# Patient Record
Sex: Female | Born: 1960 | Hispanic: Yes | Marital: Married | Smoking: Never smoker
Health system: Southern US, Community
[De-identification: ages and names within clinical notes are randomized; demographics above are authoritative.]

## PROBLEM LIST (undated history)

## (undated) DIAGNOSIS — E119 Type 2 diabetes mellitus without complications: Secondary | ICD-10-CM

## (undated) DIAGNOSIS — I10 Essential (primary) hypertension: Secondary | ICD-10-CM

## (undated) HISTORY — PX: NO PAST SURGERIES: SHX2092

---

## 2018-02-26 ENCOUNTER — Observation Stay
Admission: EM | Admit: 2018-02-26 | Discharge: 2018-02-28 | Disposition: A | Discharge status: Home / Self Care | Payer: Self-pay | Attending: Internal Medicine | Admitting: Internal Medicine

## 2018-02-26 ENCOUNTER — Emergency Department: Payer: Self-pay

## 2018-02-26 DIAGNOSIS — W010XXA Fall on same level from slipping, tripping and stumbling without subsequent striking against object, initial encounter: Secondary | ICD-10-CM | POA: Insufficient documentation

## 2018-02-26 DIAGNOSIS — E119 Type 2 diabetes mellitus without complications: Secondary | ICD-10-CM | POA: Insufficient documentation

## 2018-02-26 DIAGNOSIS — T07XXXA Unspecified multiple injuries, initial encounter: Secondary | ICD-10-CM

## 2018-02-26 DIAGNOSIS — W19XXXA Unspecified fall, initial encounter: Secondary | ICD-10-CM

## 2018-02-26 DIAGNOSIS — I10 Essential (primary) hypertension: Secondary | ICD-10-CM | POA: Insufficient documentation

## 2018-02-26 DIAGNOSIS — S43014A Anterior dislocation of right humerus, initial encounter: Secondary | ICD-10-CM

## 2018-02-26 DIAGNOSIS — J811 Chronic pulmonary edema: Secondary | ICD-10-CM

## 2018-02-26 DIAGNOSIS — J810 Acute pulmonary edema: Secondary | ICD-10-CM

## 2018-02-26 DIAGNOSIS — J9601 Acute respiratory failure with hypoxia: Principal | ICD-10-CM

## 2018-02-26 HISTORY — DX: Essential (primary) hypertension: I10

## 2018-02-26 HISTORY — DX: Type 2 diabetes mellitus without complications: E11.9

## 2018-02-26 MED ORDER — KETAMINE HCL 10 MG/ML IJ SOLN
1.0000 mg/kg | Freq: Once | INTRAMUSCULAR | Status: DC
  Filled 2018-02-26: qty 1

## 2018-02-26 MED ORDER — ONDANSETRON HCL 4 MG/2ML IJ SOLN
4.0000 mg | INTRAMUSCULAR | Status: AC
Start: 2018-02-26 — End: 2018-02-26
  Administered 2018-02-26: 4 mg via INTRAVENOUS
  Filled 2018-02-26: qty 2

## 2018-02-26 MED ORDER — MORPHINE SULFATE (PF) 4 MG/ML IV SOLN
4.0000 mg | Freq: Once | INTRAVENOUS | Status: AC
  Administered 2018-02-26: 4 mg via INTRAVENOUS
  Filled 2018-02-26: qty 1

## 2018-02-26 MED ORDER — ONDANSETRON HCL 4 MG/2ML IJ SOLN
4.0000 mg | Freq: Once | INTRAMUSCULAR | Status: AC
  Administered 2018-02-26: 4 mg via INTRAVENOUS
  Filled 2018-02-26: qty 2

## 2018-02-26 NOTE — ED Triage Notes (Signed)
Patient reports mechanical fall today. Patient c/o upper right arm, elbow and shoulder pain.

## 2018-02-26 NOTE — ED Notes (Signed)
This RN utilized the services of [employer] to assess and triage patient.

## 2018-02-26 NOTE — ED Provider Notes (Signed)
[facility] Emergency Department Provider Note  ____________________________________________   First MD Initiated Contact with Patient 02/26/18 2302     (approximate)  I have reviewed the triage vital signs and the nursing notes.   HISTORY  Chief Complaint Fall  The patient and/or family speak(s) Spanish.  They understand they have the right to the use of a hospital interpreter, however at this time they prefer to speak directly with me in Spanish.  They know that they can ask for an interpreter at any time.  I did utilize interpreter services for the discussion and consent of a procedure (both procedural sedation and reduction).   HPI Valerie Chang is a 57 y.o. female with no contributory past medical history presents for evaluation of acute onset severe sharp and throbbing pain in her right shoulder after mechanical fall.  She reports that she tripped and fell on her outstretched arm and felt immediate onset of severe pain.  She has not been able to move her shoulder since that time due to the pain and she has had some tingling in her fingers since the injury.  The injury occurred approximately 3 hours prior to my evaluation of the patient.  She received morphine 4 mg IV and Zofran 4 mill grams IV initially upon arrival and that made the pain a little bit better but it is become severe again.  She has some abrasions on her right lower leg but no other acute injuries.  She has no pain in her head or neck and she denies chest pain, shortness of breath, nausea, vomiting, abdominal pain, and pain in her legs.  She has no pain in her hands and she is able to flex and extend her elbows and her wrist without any difficulty.  Past Medical History:  Diagnosis Date  . Diabetes mellitus without complication (HCC)   . Hypertension     Patient Active Problem List   Diagnosis Date Noted  . Anterior dislocation of right shoulder 02/27/2018  . Acute respiratory  failure with hypoxia (HCC) 02/27/2018  . HTN (hypertension) 02/27/2018  . Diabetes (HCC) 02/27/2018    Past Surgical History:  Procedure Laterality Date  . NO PAST SURGERIES      Prior to Admission medications   Medication Sig Start Date End Date Taking? Authorizing Provider  HYDROcodone-acetaminophen (NORCO/VICODIN) 5-325 MG tablet Take 1 tablet by mouth every 6 (six) hours as needed for moderate pain or severe pain. 02/27/18   Loleta Rose, MD    Allergies Ketamine and Morphine and related  No family history on file.  Social History Social History   Tobacco Use  . Smoking status: Never Smoker  . Smokeless tobacco: Never Used  Substance Use Topics  . Alcohol use: Not Currently  . Drug use: Not on file    Review of Systems Constitutional: No fever/chills Eyes: No visual changes. ENT: No sore throat. Cardiovascular: Denies chest pain. Respiratory: Denies shortness of breath. Gastrointestinal: No abdominal pain.  No nausea, no vomiting.  No diarrhea.  No constipation. Genitourinary: Negative for dysuria. Musculoskeletal: Acute onset severe pain in right shoulder.  Negative for neck pain.  Negative for back pain. Integumentary: Negative for rash.  Several abrasions on right lower leg Neurological: Negative for headaches, focal weakness or numbness.   ____________________________________________   PHYSICAL EXAM:  VITAL SIGNS: ED Triage Vitals  Enc Vitals Group     BP 02/26/18 2148 (!) 171/73     Pulse Rate 02/26/18 2148 (!) 115  Resp 02/26/18 2148 18     Temp 02/26/18 2148 98.2 F (36.8 C)     Temp Source 02/26/18 2148 Oral     SpO2 02/26/18 2148 95 %     Weight 02/26/18 2151 77.1 kg (170 lb)     Height --      Head Circumference --      Peak Flow --      Pain Score 02/26/18 2151 10     Pain Loc --      Pain Edu? --      Excl. in GC? --     Constitutional: Alert and oriented.  Generally well-appearing but is in moderate to severe distress due to  right shoulder pain Eyes: Conjunctivae are normal.  Head: Atraumatic. Nose: No congestion/rhinnorhea. Mouth/Throat: Mucous membranes are moist. Neck: No stridor.  No meningeal signs.   Cardiovascular: Normal rate, regular rhythm. Good peripheral circulation. Grossly normal heart sounds. Respiratory: Normal respiratory effort.  No retractions. Lungs CTAB. Gastrointestinal: Soft and nontender. No distention.  Musculoskeletal: Obvious deformity of the right shoulder consistent with anterior shoulder dislocation Neurologic:  Normal speech and language. No gross focal neurologic deficits are appreciated.  Skin:  Skin is warm, dry and intact. No rash noted. Psychiatric: Mood and affect are normal. Speech and behavior are normal.  ____________________________________________   LABS (all labs ordered are listed, but only abnormal results are displayed)  Labs Reviewed  CBC WITH DIFFERENTIAL/PLATELET - Abnormal; Notable for the following components:      Result Value   WBC 12.7 (*)    MCV 78.9 (*)    MCH 25.3 (*)    RDW 16.0 (*)    Neutro Abs 11.3 (*)    Lymphs Abs 0.9 (*)    Basophils Absolute 0.2 (*)    All other components within normal limits  COMPREHENSIVE METABOLIC PANEL - Abnormal; Notable for the following components:   Chloride 100 (*)    Glucose, Bld 308 (*)    Calcium 8.7 (*)    AST 44 (*)    Alkaline Phosphatase 130 (*)    All other components within normal limits  TROPONIN I  BRAIN NATRIURETIC PEPTIDE   ____________________________________________  EKG  ED ECG REPORT I, Loleta Rose, the attending physician, personally viewed and interpreted this ECG.  Date: 02/27/2018 EKG Time: 1:19 Rate: 89 Rhythm: normal sinus rhythm QRS Axis: normal Intervals: Bilateral enlargement, nonspecific intraventricular conduction delay ST/T Wave abnormalities: Non-specific ST segment / T-wave changes, but no evidence of acute ischemia. Narrative Interpretation: no evidence of  acute ischemia   ____________________________________________  RADIOLOGY I, Loleta Rose, personally viewed and evaluated these images (plain radiographs) as part of my medical decision making, as well as reviewing the written report by the radiologist.  ED MD interpretation:   Anterior shoulder dislocation which was successfully reduced.  Interval development of pulmonary edema.  Official radiology report(s): Dg Shoulder Right  Result Date: 02/26/2018 CLINICAL DATA:  57 y/o F; mechanical fall with right upper arm, elbow, and shoulder pain. EXAM: RIGHT SHOULDER - 2+ VIEW COMPARISON:  None. FINDINGS: Right anterior shoulder dislocation. No acute fracture identified. Normal acromioclavicular and coracoclavicular intervals. IMPRESSION: Right anterior shoulder dislocation. No acute fracture identified. Electronically Signed   By: Mitzi Hansen M.D.   On: 02/26/2018 22:20   Dg Elbow 2 Views Right  Result Date: 02/26/2018 CLINICAL DATA:  57 y/o F; mechanical fall with right upper arm, elbow, and shoulder pain. EXAM: RIGHT ELBOW - 2 VIEW COMPARISON:  None. FINDINGS:  There is no evidence of fracture, dislocation, or joint effusion. There is no evidence of arthropathy or other focal bone abnormality. Soft tissues are unremarkable. IMPRESSION: Negative. Electronically Signed   By: Mitzi Hansen M.D.   On: 02/26/2018 22:21   Dg Chest Portable 1 View  Result Date: 02/27/2018 CLINICAL DATA:  Acute onset of difficulty breathing. EXAM: PORTABLE CHEST 1 VIEW COMPARISON:  None. FINDINGS: The lungs are well-aerated. Patchy bilateral airspace opacification is concerning for multifocal pneumonia. Mild vascular congestion is suggested. There is no evidence of pleural effusion or pneumothorax. The cardiomediastinal silhouette is borderline normal in size. No acute osseous abnormalities are seen. IMPRESSION: Patchy bilateral airspace opacification is concerning for multifocal pneumonia. Mild  vascular congestion suggested. Electronically Signed   By: Roanna Raider M.D.   On: 02/27/2018 01:41   Dg Shoulder Right Portable  Result Date: 02/27/2018 CLINICAL DATA:  57 y/o  F; post shoulder reduction. EXAM: PORTABLE RIGHT SHOULDER COMPARISON:  02/26/2018 right shoulder radiograph FINDINGS: Interval reduction of the right shoulder dislocation. The humeral head is well seated on the glenoid. No new dislocation or fracture identified. IMPRESSION: Interval reduction of right shoulder dislocation. Humeral head is well seated on the glenoid. Electronically Signed   By: Mitzi Hansen M.D.   On: 02/27/2018 01:11    ____________________________________________   PROCEDURES  Critical Care performed: Yes, see critical care procedure note(s)   Procedure(s) performed:   .Sedation Date/Time: 02/26/2018 11:20 PM Performed by: Loleta Rose, MD Authorized by: Loleta Rose, MD   Consent:    Consent obtained:  Written (electronic informed consent)   Consent given by:  Patient   Risks discussed:  Allergic reaction, dysrhythmia, inadequate sedation, nausea, vomiting, respiratory compromise necessitating ventilatory assistance and intubation, prolonged sedation necessitating reversal and prolonged hypoxia resulting in organ damage   Alternatives discussed:  Analgesia without sedation Universal protocol:    Procedure explained and questions answered to patient or proxy's satisfaction: yes     Relevant documents present and verified: yes     Test results available and properly labeled: yes     Imaging studies available: yes     Required blood products, implants, devices, and special equipment available: yes     Immediately prior to procedure a time out was called: yes     Patient identity confirmation method:  Arm band and verbally with patient Indications:    Procedure performed:  Dislocation reduction   Procedure necessitating sedation performed by:  Different physician (due to  critical patient requiring my attention, Dr. Manson Passey performed the actual reduction and was present in the room for the sedation, see his reduction note)   Intended level of sedation:  Moderate (conscious sedation) Pre-sedation assessment:    Time since last food or drink:  6.5 hours   ASA classification: class 2 - patient with mild systemic disease     Neck mobility: normal     Mouth opening:  3 or more finger widths   Mallampati score:  II - soft palate, uvula, fauces visible   Pre-sedation assessments completed and reviewed: airway patency, cardiovascular function, hydration status, mental status, nausea/vomiting, pain level, respiratory function and temperature     Pre-sedation assessment completed:  02/26/2018 11:21 PM Immediate pre-procedure details:    Reassessment: Patient reassessed immediately prior to procedure     Reviewed: vital signs, relevant labs/tests and NPO status     Verified: bag valve mask available, emergency equipment available, intubation equipment available, IV patency confirmed, oxygen available, reversal medications available and  suction available   Procedure details (see MAR for exact dosages):    Preoxygenation:  Nasal cannula   Sedation:  Ketamine   Intra-procedure monitoring:  Blood pressure monitoring, continuous pulse oximetry, cardiac monitor, frequent vital sign checks and frequent LOC assessments   Total Provider sedation time (minutes):  15 Post-procedure details:    Post-sedation assessment completed:  02/27/2018 12:42 AM   Attendance: Constant attendance by certified staff until patient recovered     Recovery: Patient returned to pre-procedure baseline     Post-sedation assessments completed and reviewed: airway patency, cardiovascular function, hydration status, mental status, nausea/vomiting, pain level and respiratory function     Patient is stable for discharge or admission: yes     Patient tolerance:  Tolerated well, no immediate  complications  .Critical Care Performed by: Loleta Rose, MD Authorized by: Loleta Rose, MD   Critical care provider statement:    Critical care time (minutes):  30   Critical care time was exclusive of:  Separately billable procedures and treating other patients   Critical care was necessary to treat or prevent imminent or life-threatening deterioration of the following conditions:  Respiratory failure   Critical care was time spent personally by me on the following activities:  Development of treatment plan with patient or surrogate, discussions with consultants, evaluation of patient's response to treatment, examination of patient, obtaining history from patient or surrogate, ordering and performing treatments and interventions, ordering and review of laboratory studies, ordering and review of radiographic studies, pulse oximetry, re-evaluation of patient's condition and review of old charts     ____________________________________________   INITIAL IMPRESSION / ASSESSMENT AND PLAN / ED COURSE  As part of my medical decision making, I reviewed the following data within the electronic MEDICAL RECORD NUMBER History obtained from family, Nursing notes reviewed and incorporated, Interpreter needed, Labs reviewed , EKG interpreted , Radiograph reviewed , Discussed with admitting physician  and Notes from prior ED visits    Differential diagnosis includes fracture or dislocation.  She did not lose consciousness and has no evidence of head or neck injury.  Radiograph is consistent with anterior shoulder dislocation of the right shoulder.  It has been out for at least 3 hours and she is very tender; I tried to gently manipulated and see if I could slowly and carefully put her back again since she had already received morphine 4 mill grams IV prior to me seeing her, but she was far too tender and protective of the limb to allow for gentle manipulation.  Given the amount of pain she was in and the  delay in the sedation in the reduction due to needing an interpreter for consent, I provided another 4 mg of morphine.  At that point she was more sedated and the pain was much better, but again she immediately resisted and started crying out in pain when I tried to gently reduce it without full procedural sedation.  I considered her with the use of the interpreter we went over the usual and customary risks and benefits both of sedation as well as for the reduction itself.  Patient and spouse agreed and the spouse reportedly signed the document since the patient had received morphine.  I was called away emergently for a critical patient that arrived by EMS requiring extensive critical care and intubation.  In the meantime, my colleague, Dr. Manson Passey, performed the sedation reduction as per my initial assessment and recommendation for the use of ketamine 1 mg/kg for the sedation.  He documented his reduction note separately in [facility].  He reported that the shoulder felt unstable but was reduced successfully in a shoulder immobilizer was put in place.  Once she was waking up and was able to respond verbally he left the room and her nurse stayed in the room.  Postreduction film has been ordered and I have evaluated the patient when I came out of my other critical patient.  She reportedly had some hypoxemia as she was waking up and was on the nonrebreather but she told me that her arm felt much better and she was no longer hurting.  Awaiting postreduction films and waiting for her to wake up further.  Clinical Course as of Feb 28 403  Fri Feb 27, 2018  0111 Patient's respiratory status has been getting worse.  She was trialed off of nonrebreather and she dropped to 81%.  She is clearing her throat and coughing and now she has thick wet lung sounds throughout her lung fields consistent with pulmonary edema.  Sitting up almost straight and on nonrebreather she is 100% but she is still coughing and clearing her throat.   I have called respiratory and asked him to emergently start her on BiPAP and I have ordered a portable chest x-ray.I am ordering lab work and an EKG.  I asked the patient and her husband again if she was having any difficulty breathing or cough before the procedure, and they said no, but then her husband offered the history that about 4 years ago she received morphine in [place] and she developed a bad cough and difficulty breathing.  This was not mentioned to had a time or listed as an allergy.   [CF]  0114 DG Shoulder Right Portable [CF]  0114 Successful reduction of right shoulder   [CF]  0133 Reviewed CXR, appears consistent with pulmonary edema which is also clinically consistent.  Patient doing a little better on bipap.  Has NTG 1-inch on chest as well to help with decreasing preload.  Discussed with Dr. Sheryle Hail for admission.   [CF]  0143 Has been clarified that it was actually him that had the reaction to morphine, not the patient.  I reviewed the vital sign data and her blood pressure was significantly elevated presumably due to her pain prior to the procedural sedation in the 187/100 range, at least prior to the second dose of morphine.  It is possible that the acute elevation of her blood pressure, either secondary to the ketamine or not, caused acute pulmonary edema, but it is notable that her blood pressure was significantly elevated prior to the procedural sedation and that it never went higher than it was before the second dose of morphine.   [CF]  0154 Nonspecific leukocytosis in the setting of acute injury and pain.  WBC(!): 12.7 [CF]  0214 Hyperglycemic, otherwise unremarkable.  Comprehensive metabolic panel(!) [CF]  [id number] Patient stable on bipap.  Awaiting admission by Dr. Sheryle Hail.   [CF]  [phone] Discussed case in person several times with Dr. Anne Hahn, the admitting physician.  Discussed possibility of aspiration after getting morphine or during sedation, and he is covering empirically  with antibiotics.  Patient is stable on bipap and was just transferred up to the ICU.  Answered any/all questions by patient and husband.   [CF]    Clinical Course User Index [CF] Loleta Rose, MD    ____________________________________________  FINAL CLINICAL IMPRESSION(S) / ED DIAGNOSES  Final diagnoses:  Anterior shoulder dislocation, right, initial encounter  Fall,  initial encounter  Multiple abrasions  Acute respiratory failure with hypoxemia (HCC)  Acute pulmonary edema (HCC)     MEDICATIONS GIVEN DURING THIS VISIT:  Medications  meropenem (MERREM) 1 g in sodium chloride 0.9 % 100 mL IVPB (has no administration in time range)  morphine 4 MG/ML injection 4 mg (4 mg Intravenous Given 02/26/18 2251)  ondansetron (ZOFRAN) injection 4 mg (4 mg Intravenous Given 02/26/18 2249)  ondansetron (ZOFRAN) injection 4 mg (4 mg Intravenous Given 02/26/18 2325)  morphine 4 MG/ML injection 4 mg (4 mg Intravenous Given 02/26/18 2326)  ketamine (KETALAR) injection 77 mg (77 mg Intravenous Given 02/27/18 0033)  naloxone (NARCAN) 2 MG/2ML injection (1 mg Intravenous Given 02/27/18 0034)  nitroGLYCERIN (NITROGLYN) 2 % ointment 1 inch (1 inch Topical Given 02/27/18 0126)     ED Discharge Orders        Ordered    HYDROcodone-acetaminophen (NORCO/VICODIN) 5-325 MG tablet  Every 6 hours PRN     02/27/18 0046       Note:  This document was prepared using Dragon voice recognition software and may include unintentional dictation errors.    Loleta Rose, MD 02/27/18 [phone]

## 2018-02-27 ENCOUNTER — Other Ambulatory Visit: Payer: Self-pay

## 2018-02-27 ENCOUNTER — Emergency Department: Payer: Self-pay

## 2018-02-27 ENCOUNTER — Encounter: Payer: Self-pay | Admitting: Internal Medicine

## 2018-02-27 DIAGNOSIS — E119 Type 2 diabetes mellitus without complications: Secondary | ICD-10-CM

## 2018-02-27 DIAGNOSIS — J9601 Acute respiratory failure with hypoxia: Secondary | ICD-10-CM | POA: Diagnosis present

## 2018-02-27 DIAGNOSIS — I10 Essential (primary) hypertension: Secondary | ICD-10-CM | POA: Diagnosis present

## 2018-02-27 DIAGNOSIS — S43014A Anterior dislocation of right humerus, initial encounter: Secondary | ICD-10-CM | POA: Diagnosis present

## 2018-02-27 LAB — COMPREHENSIVE METABOLIC PANEL
ALBUMIN: 3.8 g/dL (ref 3.5–5.0)
ALT: 52 U/L (ref 14–54)
ANION GAP: 11 (ref 5–15)
AST: 44 U/L — ABNORMAL HIGH (ref 15–41)
Alkaline Phosphatase: 130 U/L — ABNORMAL HIGH (ref 38–126)
BUN: 14 mg/dL (ref 6–20)
CHLORIDE: 100 mmol/L — AB (ref 101–111)
CO2: 24 mmol/L (ref 22–32)
Calcium: 8.7 mg/dL — ABNORMAL LOW (ref 8.9–10.3)
Creatinine, Ser: 0.52 mg/dL (ref 0.44–1.00)
GFR calc Af Amer: >60 mL/min (ref >60)
GFR calc non Af Amer: >60 mL/min (ref >60)
GLUCOSE: 308 mg/dL — AB (ref 65–99)
POTASSIUM: 4 mmol/L (ref 3.5–5.1)
Sodium: 135 mmol/L (ref 135–145)
TOTAL PROTEIN: 7.3 g/dL (ref 6.5–8.1)
Total Bilirubin: 0.5 mg/dL (ref 0.3–1.2)

## 2018-02-27 LAB — CBC
HCT: 33.8 % — ABNORMAL LOW (ref 35.0–47.0)
HEMOGLOBIN: 11 g/dL — AB (ref 12.0–16.0)
MCH: 25.6 pg — AB (ref 26.0–34.0)
MCHC: 32.5 g/dL (ref 32.0–36.0)
MCV: 78.9 fL — AB (ref 80.0–100.0)
Platelets: 241 10*3/uL (ref 150–440)
RBC: 4.28 MIL/uL (ref 3.80–5.20)
RDW: 16 % — ABNORMAL HIGH (ref 11.5–14.5)
WBC: 12.5 10*3/uL — AB (ref 3.6–11.0)

## 2018-02-27 LAB — CREATININE, SERUM
Creatinine, Ser: 0.46 mg/dL (ref 0.44–1.00)
GFR calc non Af Amer: >60 mL/min (ref >60)

## 2018-02-27 LAB — CBC WITH DIFFERENTIAL/PLATELET
BASOS ABS: 0.2 10*3/uL — AB (ref 0–0.1)
BASOS PCT: 1 %
EOS ABS: 0 10*3/uL (ref 0–0.7)
EOS PCT: 0 %
HCT: 37.6 % (ref 35.0–47.0)
HEMOGLOBIN: 12 g/dL (ref 12.0–16.0)
Lymphocytes Relative: 7 %
Lymphs Abs: 0.9 10*3/uL — ABNORMAL LOW (ref 1.0–3.6)
MCH: 25.3 pg — ABNORMAL LOW (ref 26.0–34.0)
MCHC: 32 g/dL (ref 32.0–36.0)
MCV: 78.9 fL — ABNORMAL LOW (ref 80.0–100.0)
Monocytes Absolute: 0.3 10*3/uL (ref 0.2–0.9)
Monocytes Relative: 3 %
NEUTROS PCT: 89 %
Neutro Abs: 11.3 10*3/uL — ABNORMAL HIGH (ref 1.4–6.5)
PLATELETS: 253 10*3/uL (ref 150–440)
RBC: 4.76 MIL/uL (ref 3.80–5.20)
RDW: 16 % — ABNORMAL HIGH (ref 11.5–14.5)
WBC: 12.7 10*3/uL — AB (ref 3.6–11.0)

## 2018-02-27 LAB — GLUCOSE, CAPILLARY
GLUCOSE-CAPILLARY: 195 mg/dL — AB (ref 65–99)
Glucose-Capillary: 289 mg/dL — ABNORMAL HIGH (ref 65–99)
Glucose-Capillary: 237 mg/dL — ABNORMAL HIGH (ref 65–99)
Glucose-Capillary: 180 mg/dL — ABNORMAL HIGH (ref 65–99)
Glucose-Capillary: 178 mg/dL — ABNORMAL HIGH (ref 65–99)

## 2018-02-27 LAB — MRSA PCR SCREENING: MRSA by PCR: NEGATIVE

## 2018-02-27 LAB — TROPONIN I

## 2018-02-27 LAB — BRAIN NATRIURETIC PEPTIDE: B NATRIURETIC PEPTIDE 5: 41 pg/mL (ref 0.0–100.0)

## 2018-02-27 IMAGING — DX DG CHEST 1V PORT
1 series · 1 of 1 positions shown · non-contrast
Comparison: None.

CLINICAL DATA: Acute onset of difficulty breathing.

EXAM:
PORTABLE CHEST 1 VIEW

[chest ap]
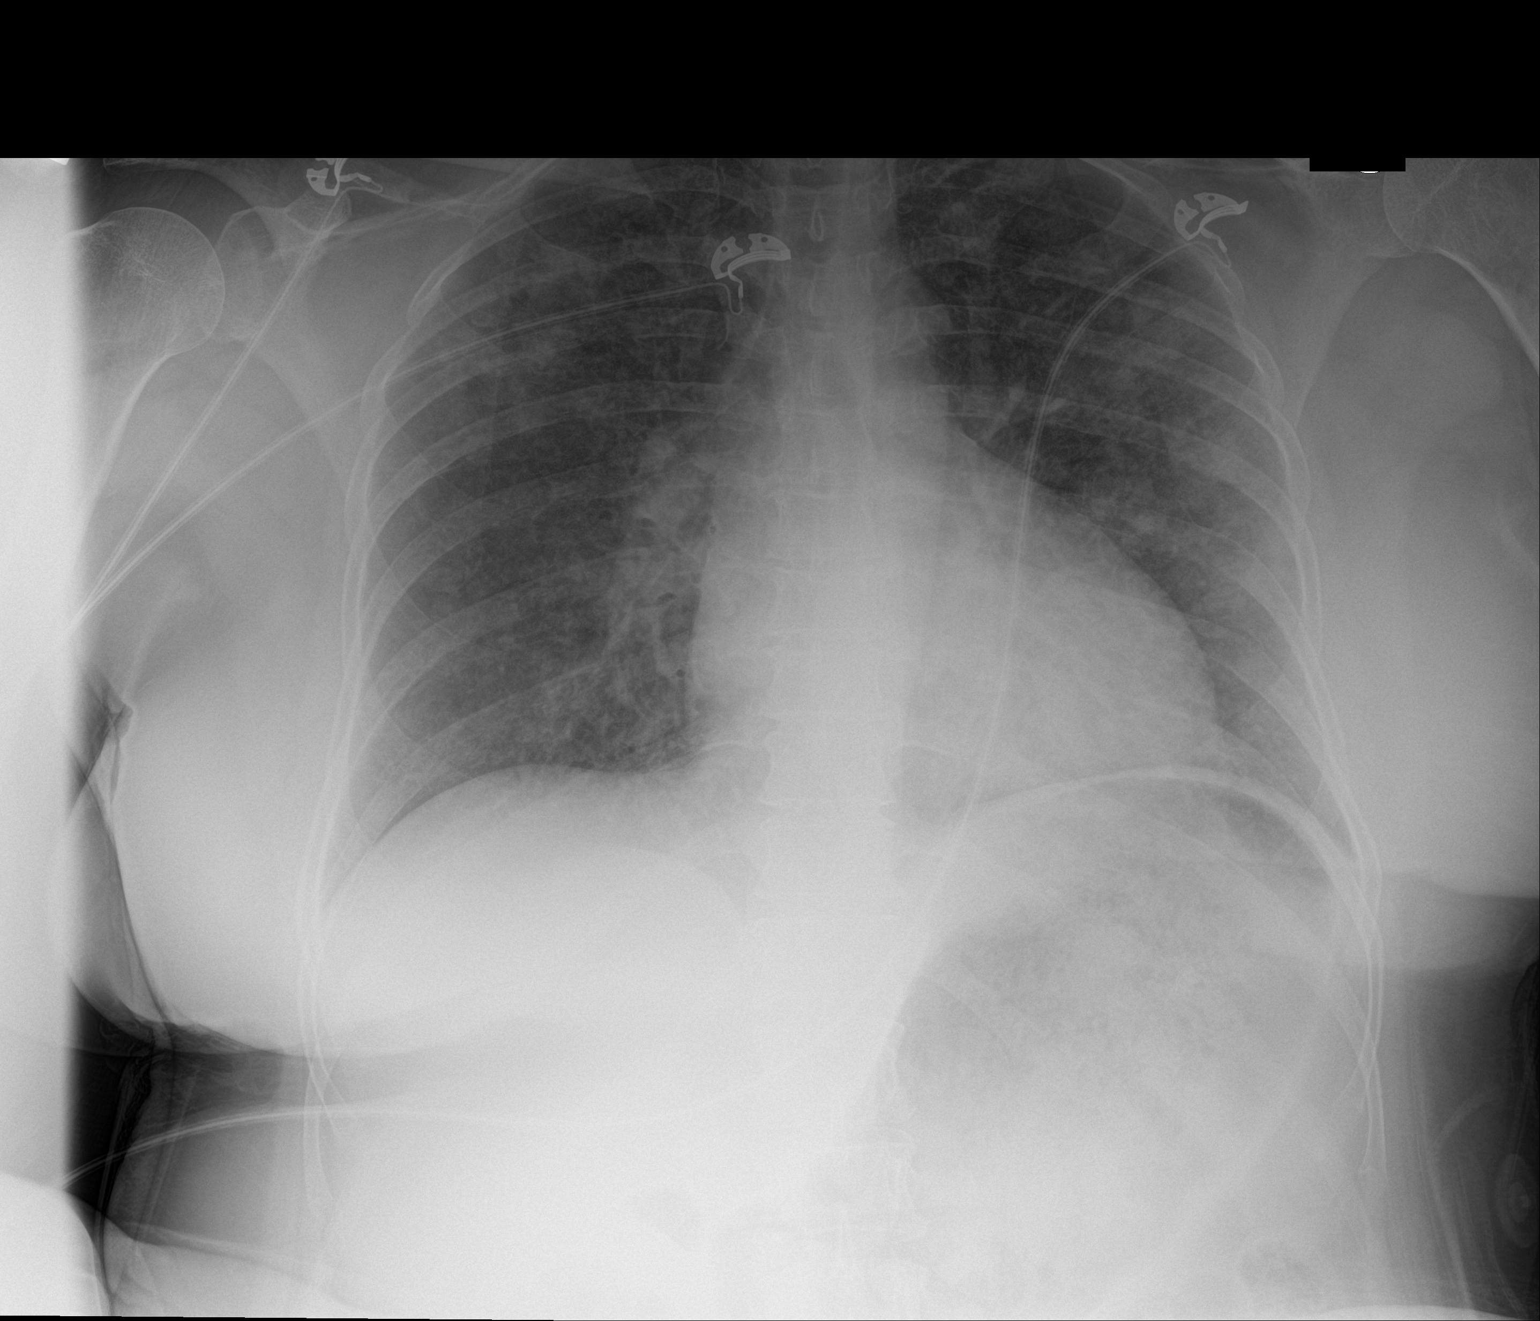

[1 of 1 positions shown; findings below may reference images not displayed]

FINDINGS: The lungs are well-aerated. Patchy bilateral airspace opacification
is concerning for multifocal pneumonia. Mild vascular congestion is
suggested. There is no evidence of pleural effusion or pneumothorax.

The cardiomediastinal silhouette is borderline normal in size. No
acute osseous abnormalities are seen.
IMPRESSION: Patchy bilateral airspace opacification is concerning for multifocal
pneumonia. Mild vascular congestion suggested.

## 2018-02-27 MED ORDER — ENOXAPARIN SODIUM 40 MG/0.4ML ~~LOC~~ SOLN
40.0000 mg | SUBCUTANEOUS | Status: DC
  Administered 2018-02-27: 23:00:00 40 mg via SUBCUTANEOUS
  Filled 2018-02-27: qty 0.4

## 2018-02-27 MED ORDER — ONDANSETRON HCL 4 MG PO TABS: 4.0000 mg | ORAL_TABLET | Freq: Four times a day (QID) | ORAL | Status: DC | PRN

## 2018-02-27 MED ORDER — NITROGLYCERIN 2 % TD OINT
TOPICAL_OINTMENT | TRANSDERMAL | Status: AC
  Administered 2018-02-27: 1 [in_us] via TOPICAL
  Filled 2018-02-27: qty 1

## 2018-02-27 MED ORDER — AMPICILLIN-SULBACTAM SODIUM 3 (2-1) G IJ SOLR
3.0000 g | Freq: Four times a day (QID) | INTRAMUSCULAR | Status: DC
  Administered 2018-02-27 – 2018-02-28 (×2): 3 g via INTRAVENOUS
  Filled 2018-02-27 (×7): qty 3

## 2018-02-27 MED ORDER — HYDROCODONE-ACETAMINOPHEN 5-325 MG PO TABS: 1.0000 | ORAL_TABLET | Freq: Four times a day (QID) | ORAL | 0 refills | Status: DC | PRN

## 2018-02-27 MED ORDER — TRAMADOL HCL 50 MG PO TABS: 50.0000 mg | ORAL_TABLET | Freq: Four times a day (QID) | ORAL | Status: DC | PRN

## 2018-02-27 MED ORDER — SODIUM CHLORIDE 0.9 % IV SOLN
1.0000 g | Freq: Three times a day (TID) | INTRAVENOUS | Status: DC
  Administered 2018-02-27 (×2): 1 g via INTRAVENOUS
  Filled 2018-02-27 (×3): qty 1

## 2018-02-27 MED ORDER — ACETAMINOPHEN 650 MG RE SUPP: 650.0000 mg | Freq: Four times a day (QID) | RECTAL | Status: DC | PRN

## 2018-02-27 MED ORDER — NALOXONE HCL 2 MG/2ML IJ SOSY
PREFILLED_SYRINGE | INTRAMUSCULAR | Status: AC
  Administered 2018-02-27: 1 mg via INTRAVENOUS
  Filled 2018-02-27: qty 2

## 2018-02-27 MED ORDER — ACETAMINOPHEN 325 MG PO TABS
650.0000 mg | ORAL_TABLET | Freq: Four times a day (QID) | ORAL | Status: DC | PRN
  Administered 2018-02-27: 18:00:00 650 mg via ORAL
  Filled 2018-02-27: qty 2

## 2018-02-27 MED ORDER — INSULIN ASPART 100 UNIT/ML ~~LOC~~ SOLN
0.0000 [IU] | Freq: Three times a day (TID) | SUBCUTANEOUS | Status: DC
  Administered 2018-02-27: 2 [IU] via SUBCUTANEOUS
  Administered 2018-02-27: 3 [IU] via SUBCUTANEOUS
  Administered 2018-02-27 – 2018-02-28 (×3): 2 [IU] via SUBCUTANEOUS
  Filled 2018-02-27 (×4): qty 1

## 2018-02-27 MED ORDER — KETAMINE HCL 10 MG/ML IJ SOLN
1.0000 mg/kg | Freq: Once | INTRAMUSCULAR | Status: AC
Start: 2018-02-27 — End: 2018-02-27
  Administered 2018-02-27: 77 mg via INTRAVENOUS

## 2018-02-27 MED ORDER — NITROGLYCERIN 2 % TD OINT
1.0000 [in_us] | TOPICAL_OINTMENT | Freq: Once | TRANSDERMAL | Status: AC
  Administered 2018-02-27: 1 [in_us] via TOPICAL

## 2018-02-27 MED ORDER — ONDANSETRON HCL 4 MG/2ML IJ SOLN
4.0000 mg | Freq: Four times a day (QID) | INTRAMUSCULAR | Status: DC | PRN
  Administered 2018-02-27: 4 mg via INTRAVENOUS
  Filled 2018-02-27: qty 2

## 2018-02-27 MED ORDER — INSULIN ASPART 100 UNIT/ML ~~LOC~~ SOLN: 0.0000 [IU] | Freq: Every day | SUBCUTANEOUS | Status: DC

## 2018-02-27 NOTE — Discharge Instructions (Addendum)
It is very important that you keep on the shoulder immobilizer and follow up with orthopedics.  Your shoulder is very unstable, meaning that it may fall out of place again.  Use over-the-counter pain medication (Tylenol and ibuprofen) according to label instructions.  You may use ice packs to help with pain and swelling.  Take Norco as prescribed for severe pain. Do not drink alcohol, drive or participate in any other potentially dangerous activities while taking this medication as it may make you sleepy. Do not take this medication with any other sedating medications, either prescription or over-the-counter. If you were prescribed Percocet or Vicodin, do not take these with acetaminophen (Tylenol) as it is already contained within these medications.   This medication is an opiate (or narcotic) pain medication and can be habit forming.  Use it as little as possible to achieve adequate pain control.  Do not use or use it with extreme caution if you have a history of opiate abuse or dependence.  If you are on a pain contract with your primary care doctor or a pain specialist, be sure to let them know you were prescribed this medication today from the [facility].  This medication is intended for your use only - do not give any to anyone else and keep it in a secure place where nobody else, especially children, have access to it.  It will also cause or worsen constipation, so you may want to consider taking an over-the-counter stool softener while you are taking this medication.    Return to the emergency department if you develop new or worsening symptoms that concern you.

## 2018-02-27 NOTE — Sedation Documentation (Signed)
Family updated as to patient's status.

## 2018-02-27 NOTE — Sedation Documentation (Signed)
ED Provider at bedside discussing change on pt's ability to breathe

## 2018-02-27 NOTE — Progress Notes (Signed)
Principal Problem:   Acute respiratory failure with hypoxia (HCC) -unclear etiology, though sedating medications almost certainly played a role.  Patient did not receive larger than normal doses, though she seemed to have unforeseeable he reacted very strongly to these medicines.  We will continue BiPAP for now until the patient is able to wean, see below for treatment of other conditions Active Problems:   Anterior dislocation of right shoulder -successfully reduced, continue immobilizer   Pneumonia -unclear if her x-ray imaging represents pneumonia, possibly from aspiration during her hypoxia event.  Covered her with antibiotics upfront.  Would repeat chest x-ray in the morning to see if it is any different, just to rule out any possibility of edema as a causative agent of her x-ray opacities   HTN (hypertension) -continue home meds   Diabetes (HCC) -sliding scale insulin with corresponding glucose checks  Chart review performedand case discussed with ED provider. Labs, imaging and/or ECG reviewed by provider and discussed with patient/family. Management plans discussed with the patient and/or family.  DVT PROPHYLAXIS: SubQ lovenox  GI PROPHYLAXIS: None  ADMISSION STATUS: Observation  Agree with above

## 2018-02-27 NOTE — Sedation Documentation (Signed)
ED Provider at bedside. Pt and husband updated.

## 2018-02-27 NOTE — Consult Note (Signed)
Name: Valerie Chang MRN: [id number] DOB: 26-Jul-1961    ADMISSION DATE:  02/26/2018 CONSULTATION DATE: 02/27/2018  REFERRING MD : Dr. Anne Hahn   CHIEF COMPLAINT: Fall   BRIEF PATIENT DESCRIPTION: 57 yo female admitted with acute hypoxic respiratory failure requiring Bipap possibly secondary to aspiration and pulmonary edema due to sedating medication received during reduction of her right shoulder due to dislocation following a fall at home   SIGNIFICANT EVENTS/STUDIES:  05/31 Pt admitted to stepdown unit   HISTORY OF PRESENT ILLNESS:   This is a 57 yo female with a PMH of Diabetes Mellitus and HTN.  She presented to [facility] ER on 05/30 following a fall at home, the fall occurred 3hrs prior to presentation.  During the fall at home she tripped and fell on her outstretched hand resulting in severe pain, inability to move her shoulder due to pain, and some tingling in her right hand.  In the ER xray revealed a right anterior shoulder dislocation and no acute fracture identified.  Despite pain medication the right shoulder pain remained severe requiring reduction of the right shoulder.  She received 1 mg/kg of ketamine during the reduction following administration she had some hypoxia requiring NRB.  She developed worsening respiratory distress requiring Bipap.  CXR concerning for vascular congestion, 1 inch nitropaste placed to reduce preload.  She was subsequently admitted to the stepdown unit by hospitalist team for further workup and treatment.   PAST MEDICAL HISTORY :   has a past medical history of Diabetes mellitus without complication (HCC) and Hypertension.  has a past surgical history that includes No past surgeries. Prior to Admission medications   Medication Sig Start Date End Date Taking? Authorizing Provider  HYDROcodone-acetaminophen (NORCO/VICODIN) 5-325 MG tablet Take 1 tablet by mouth every 6 (six) hours as needed for moderate pain or severe pain. 02/27/18   Loleta Rose, MD   Allergies  Allergen Reactions  . Ketamine Other (See Comments)    Developed acute pulmonary edema and hypoxemia after procedural sedation with ketamine, but also had morphine prior to the ketamine. Uncertain which agent was causative.  . Morphine And Related     Developed acute pulmonary edema and hypoxemia after procedural sedation with ketamine, but also had morphine prior to the ketamine. Uncertain which agent was causative.    FAMILY HISTORY:  family history is not on file. SOCIAL HISTORY:  reports that she has never smoked. She has never used smokeless tobacco. She reports that she drank alcohol.  REVIEW OF SYSTEMS: Positives in BOLD  Constitutional: Negative for fever, chills, weight loss, malaise/fatigue and diaphoresis.  HENT: Negative for hearing loss, ear pain, nosebleeds, congestion, sore throat, neck pain, tinnitus and ear discharge.   Eyes: Negative for blurred vision, double vision, photophobia, pain, discharge and redness.  Respiratory: cough, hemoptysis, sputum production, shortness of breath, wheezing and stridor.   Cardiovascular: Negative for chest pain, palpitations, orthopnea, claudication, leg swelling and PND.  Gastrointestinal: Negative for heartburn, nausea, vomiting, abdominal pain, diarrhea, constipation, blood in stool and melena.  Genitourinary: Negative for dysuria, urgency, frequency, hematuria and flank pain.  Musculoskeletal: right shoulder pain, myalgias, back pain, joint pain and falls.  Skin: Negative for itching and rash.  Neurological: Negative for dizziness, tingling, tremors, sensory change, speech change, focal weakness, seizures, loss of consciousness, weakness and headaches.  Endo/Heme/Allergies: Negative for environmental allergies and polydipsia. Does not bruise/bleed easily.  SUBJECTIVE:  No complaints at this time   VITAL SIGNS: Temp:  [98.2 F (36.8 C)]  98.2 F (36.8 C) (05/30 2148) Pulse Rate:  [76-121] 76 (05/31  0345) Resp:  [16-29] 19 (05/31 0345) BP: (129-198)/(63-136) 153/73 (05/31 0345) SpO2:  [88 %-100 %] 100 % (05/31 0345) Weight:  [77.1 kg (170 lb)] 77.1 kg (170 lb) (05/30 2151)  PHYSICAL EXAMINATION: General: well developed, well nourished female, NAD  Neuro: alert and oriented, follows commands HEENT: supple, no JVD  Cardiovascular: sinus tach, no R/G, 2+ bilateral radial pulses  Lungs: clear throughout, even, non labored  Abdomen: +BS x4, soft, obese, non distended, non tender  Musculoskeletal: right shoulder immobilizer in place Skin: scattered abrasions bilateral lower extremities   Recent Labs  Lab 02/27/18 0119  NA 135  K 4.0  CL 100*  CO2 24  BUN 14  CREATININE 0.52  GLUCOSE 308*   Recent Labs  Lab 02/27/18 0119  HGB 12.0  HCT 37.6  WBC 12.7*  PLT 253   Dg Shoulder Right  Result Date: 02/26/2018 CLINICAL DATA:  57 y/o F; mechanical fall with right upper arm, elbow, and shoulder pain. EXAM: RIGHT SHOULDER - 2+ VIEW COMPARISON:  None. FINDINGS: Right anterior shoulder dislocation. No acute fracture identified. Normal acromioclavicular and coracoclavicular intervals. IMPRESSION: Right anterior shoulder dislocation. No acute fracture identified. Electronically Signed   By: Mitzi Hansen M.D.   On: 02/26/2018 22:20   Dg Elbow 2 Views Right  Result Date: 02/26/2018 CLINICAL DATA:  57 y/o F; mechanical fall with right upper arm, elbow, and shoulder pain. EXAM: RIGHT ELBOW - 2 VIEW COMPARISON:  None. FINDINGS: There is no evidence of fracture, dislocation, or joint effusion. There is no evidence of arthropathy or other focal bone abnormality. Soft tissues are unremarkable. IMPRESSION: Negative. Electronically Signed   By: Mitzi Hansen M.D.   On: 02/26/2018 22:21   Dg Chest Portable 1 View  Result Date: 02/27/2018 CLINICAL DATA:  Acute onset of difficulty breathing. EXAM: PORTABLE CHEST 1 VIEW COMPARISON:  None. FINDINGS: The lungs are well-aerated.  Patchy bilateral airspace opacification is concerning for multifocal pneumonia. Mild vascular congestion is suggested. There is no evidence of pleural effusion or pneumothorax. The cardiomediastinal silhouette is borderline normal in size. No acute osseous abnormalities are seen. IMPRESSION: Patchy bilateral airspace opacification is concerning for multifocal pneumonia. Mild vascular congestion suggested. Electronically Signed   By: Roanna Raider M.D.   On: 02/27/2018 01:41   Dg Shoulder Right Portable  Result Date: 02/27/2018 CLINICAL DATA:  57 y/o  F; post shoulder reduction. EXAM: PORTABLE RIGHT SHOULDER COMPARISON:  02/26/2018 right shoulder radiograph FINDINGS: Interval reduction of the right shoulder dislocation. The humeral head is well seated on the glenoid. No new dislocation or fracture identified. IMPRESSION: Interval reduction of right shoulder dislocation. Humeral head is well seated on the glenoid. Electronically Signed   By: Mitzi Hansen M.D.   On: 02/27/2018 01:11    ASSESSMENT / PLAN: Right Anterior Shoulder Dislocation s/p Reduction  Acute hypoxic respiratory failure possibly secondary to pulmonary edema due to sedating medication and possible aspiration Acute Pain  Diabetes Mellitus Hx: HTN  P: Prn Bipap for dyspnea and/or hypoxia  Repeat CXR in am  Continuous telemetry monitoring  Trend BMP  Replace electrolytes as indicated  Monitor UOP VTE px: lovenox  Trend CBC  Monitor for s/sx of bleeding and transfuse for hgb <7 SSI  Continue right shoulder immobilizer Prn tramadol for pain management   -Updated pt regarding plan of care tele spanish interpretor utilized pt stated she understood plan of care  Sonda Rumble, [place]  Pulmonary/Critical Care Pager [phone] (please enter 7 digits) PCCM Consult Pager [phone] (please enter 7 digits)

## 2018-02-27 NOTE — Sedation Documentation (Signed)
Trial off NRB while obtaining post-reduction xray. Pt desatting < 90%. NRB replaced after 30 secs on room air.

## 2018-02-27 NOTE — Sedation Documentation (Signed)
Patient denies pain and is resting comfortably.  

## 2018-02-27 NOTE — ED Notes (Signed)
Dr. Manson Passey @ bedside to see pt.

## 2018-02-27 NOTE — Sedation Documentation (Signed)
Pt placed on O2/2L/min/[place]. Unable to maintain SaO2 > 90%. Pt placed on NRB again. EDP informed.

## 2018-02-27 NOTE — ED Provider Notes (Signed)
.  Ortho Injury Treatment Date/Time: 02/27/2018 12:39 AM Performed by: Darci CurrentBrown, [place] N, MD Authorized by: Darci CurrentBrown, [place] N, MD   Consent:    Consent obtained:  Written and verbal   Consent given by:  Patient and spouse   Risks discussed:  Irreducible dislocation, nerve damage, recurrent dislocation, restricted joint movement and vascular damageInjury location: shoulder Location details: right shoulder Injury type: dislocation Dislocation type: anterior Chronicity: new Pre-procedure distal perfusion: normal Pre-procedure neurological function: diminished Pre-procedure range of motion: reduced  Anesthesia: Local anesthesia used: no  Patient sedated: Yes. Refer to sedation procedure documentation for details of sedation. Immobilization: shoulder immobilizer. Post-procedure neurovascular assessment: post-procedure neurovascularly intact Post-procedure distal perfusion: normal Post-procedure neurological function: normal Post-procedure range of motion comment: not checked secondary to immobilizer Patient tolerance: Patient tolerated the procedure well with no immediate complications       Darci CurrentBrown, [place] N, MD 02/27/18 [phone]

## 2018-02-27 NOTE — Progress Notes (Signed)
ANTIBIOTIC CONSULT NOTE - INITIAL  Pharmacy Consult for Meropenem  Indication: pneumonia  Allergies  Allergen Reactions  . Ketamine Other (See Comments)    Developed acute pulmonary edema and hypoxemia after procedural sedation with ketamine, but also had morphine prior to the ketamine. Uncertain which agent was causative.  . Morphine And Related     Developed acute pulmonary edema and hypoxemia after procedural sedation with ketamine, but also had morphine prior to the ketamine. Uncertain which agent was causative.    Patient Measurements: Weight: 170 lb (77.1 kg) Adjusted Body Weight:   Vital Signs: Temp: 98.2 F (36.8 C) (05/30 2148) Temp Source: Oral (05/30 2148) BP: 131/71 (05/31 0300) Pulse Rate: 86 (05/31 0300) Intake/Output from previous day: No intake/output data recorded. Intake/Output from this shift: No intake/output data recorded.  Labs: Recent Labs    02/27/18 0119  WBC 12.7*  HGB 12.0  PLT 253  CREATININE 0.52   CrCl cannot be calculated (Unknown ideal weight.). No results for input(s): VANCOTROUGH, VANCOPEAK, VANCORANDOM, GENTTROUGH, GENTPEAK, GENTRANDOM, TOBRATROUGH, TOBRAPEAK, TOBRARND, AMIKACINPEAK, AMIKACINTROU, AMIKACIN in the last 72 hours.   Microbiology: No results found for this or any previous visit (from the past 720 hour(s)).  Medical History: Past Medical History:  Diagnosis Date  . Diabetes mellitus without complication (HCC)   . Hypertension     Medications:   (Not in a hospital admission) Assessment: SrCr = 0.5   Goal of Therapy:  resolution of infection  Plan:  Expected duration 7 days with resolution of temperature and/or normalization of WBC   Will order Meropenem 1 gm IV Q8H to start on 5/31 @ 0330.   Valerie Chang D 02/27/2018,3:24 AM

## 2018-02-27 NOTE — H&P (Signed)
[facility] [place] [facility]   PATIENT NAME: Valerie Chang    MR#:  [id number]  DATE OF BIRTH:  1961/05/30  DATE OF ADMISSION:  02/26/2018  PRIMARY CARE PHYSICIAN: [facility]   REQUESTING/REFERRING PHYSICIAN: York Cerise, MD  CHIEF COMPLAINT:   Chief Complaint  Patient presents with  . Fall    HISTORY OF PRESENT ILLNESS:  Valerie Chang  is a 57 y.o. female who presents with shoulder pain after mechanical fall.  Patient was found here to have dislocation of her shoulder.  She received some sedating medications in preparation for reduction.  Reduction of the shoulder by ED physician was successful.  Patient subsequently became very lethargic and somnolent.  She became hypoxic, requiring BiPAP.  Blood pressure also dropped significantly.  Patient began to wake up, but continued to require BiPAP, and hospitalist were called for admission and further evaluation.  On this writer's examination patient is arousable and oriented.  She denies any respiratory symptoms prior to coming to the ED today.  PAST MEDICAL HISTORY:   Past Medical History:  Diagnosis Date  . Diabetes mellitus without complication (HCC)   . Hypertension      PAST SURGICAL HISTORY:   Past Surgical History:  Procedure Laterality Date  . NO PAST SURGERIES       SOCIAL HISTORY:   Social History   Tobacco Use  . Smoking status: Never Smoker  . Smokeless tobacco: Never Used  Substance Use Topics  . Alcohol use: Not Currently     FAMILY HISTORY:  Family history reviewed and is non-contributory   DRUG ALLERGIES:   Allergies  Allergen Reactions  . Ketamine Other (See Comments)    Developed acute pulmonary edema and hypoxemia after procedural sedation with ketamine, but also had morphine prior to the ketamine. Uncertain which agent was causative.  . Morphine And Related     Developed acute pulmonary edema and hypoxemia after  procedural sedation with ketamine, but also had morphine prior to the ketamine. Uncertain which agent was causative.    MEDICATIONS AT HOME:   Prior to Admission medications   Medication Sig Start Date End Date Taking? Authorizing Provider  HYDROcodone-acetaminophen (NORCO/VICODIN) 5-325 MG tablet Take 1 tablet by mouth every 6 (six) hours as needed for moderate pain or severe pain. 02/27/18   Loleta Rose, MD    REVIEW OF SYSTEMS:  Review of Systems  Constitutional: Negative for chills, fever, malaise/fatigue and weight loss.  HENT: Negative for ear pain, hearing loss and tinnitus.   Eyes: Negative for blurred vision, double vision, pain and redness.  Respiratory: Negative for cough, hemoptysis and shortness of breath.   Cardiovascular: Negative for chest pain, palpitations, orthopnea and leg swelling.  Gastrointestinal: Negative for abdominal pain, constipation, diarrhea, nausea and vomiting.  Genitourinary: Negative for dysuria, frequency and hematuria.  Musculoskeletal: Positive for joint pain (Right shoulder). Negative for back pain and neck pain.  Skin:       No acne, rash, or lesions  Neurological: Negative for dizziness, tremors, focal weakness and weakness.  Endo/Heme/Allergies: Negative for polydipsia. Does not bruise/bleed easily.  Psychiatric/Behavioral: Negative for depression. The patient is not nervous/anxious and does not have insomnia.      VITAL SIGNS:   Vitals:   02/27/18 0115 02/27/18 0130 02/27/18 0145 02/27/18 0200  BP: (!) 158/74 (!) 142/71 (!) 142/75 (!) 152/77  Pulse: 88 81 94 92  Resp: 16 17 18  (!) 29  Temp:  TempSrc:      SpO2: 100% 100% 100% 97%  Weight:       Wt Readings from Last 3 Encounters:  02/26/18 77.1 kg (170 lb)    PHYSICAL EXAMINATION:  Physical Exam  Vitals reviewed. Constitutional: She is oriented to person, place, and time. She appears well-developed and well-nourished. No distress.  HENT:  Head: Normocephalic and  atraumatic.  Mouth/Throat: Oropharynx is clear and moist.  Eyes: Pupils are equal, round, and reactive to light. Conjunctivae and EOM are normal. No scleral icterus.  Neck: Normal range of motion. Neck supple. No JVD present. No thyromegaly present.  Cardiovascular: Normal rate, regular rhythm and intact distal pulses. Exam reveals no gallop and no friction rub.  No murmur heard. Respiratory: Effort normal. No respiratory distress. She has no wheezes. She has no rales.  Rhonchi  GI: Soft. Bowel sounds are normal. She exhibits no distension. There is no tenderness.  Musculoskeletal: Normal range of motion. She exhibits no edema.  Right shoulder immobilizer in place  Lymphadenopathy:    She has no cervical adenopathy.  Neurological: She is alert and oriented to person, place, and time. No cranial nerve deficit.  No dysarthria, no aphasia  Skin: Skin is warm and dry. No rash noted. No erythema.  Psychiatric: She has a normal mood and affect. Her behavior is normal. Judgment and thought content normal.    LABORATORY PANEL:   CBC Recent Labs  Lab 02/27/18 0119  WBC 12.7*  HGB 12.0  HCT 37.6  PLT 253   ------------------------------------------------------------------------------------------------------------------  Chemistries  Recent Labs  Lab 02/27/18 0119  NA 135  K 4.0  CL 100*  CO2 24  GLUCOSE 308*  BUN 14  CREATININE 0.52  CALCIUM 8.7*  AST 44*  ALT 52  ALKPHOS 130*  BILITOT 0.5   ------------------------------------------------------------------------------------------------------------------  Cardiac Enzymes Recent Labs  Lab 02/27/18 0119  TROPONINI <0.03   ------------------------------------------------------------------------------------------------------------------  RADIOLOGY:  Dg Shoulder Right  Result Date: 02/26/2018 CLINICAL DATA:  57 y/o F; mechanical fall with right upper arm, elbow, and shoulder pain. EXAM: RIGHT SHOULDER - 2+ VIEW  COMPARISON:  None. FINDINGS: Right anterior shoulder dislocation. No acute fracture identified. Normal acromioclavicular and coracoclavicular intervals. IMPRESSION: Right anterior shoulder dislocation. No acute fracture identified. Electronically Signed   By: Mitzi HansenLance  Furusawa-Stratton M.D.   On: 02/26/2018 22:20   Dg Elbow 2 Views Right  Result Date: 02/26/2018 CLINICAL DATA:  57 y/o F; mechanical fall with right upper arm, elbow, and shoulder pain. EXAM: RIGHT ELBOW - 2 VIEW COMPARISON:  None. FINDINGS: There is no evidence of fracture, dislocation, or joint effusion. There is no evidence of arthropathy or other focal bone abnormality. Soft tissues are unremarkable. IMPRESSION: Negative. Electronically Signed   By: Mitzi HansenLance  Furusawa-Stratton M.D.   On: 02/26/2018 22:21   Dg Chest Portable 1 View  Result Date: 02/27/2018 CLINICAL DATA:  Acute onset of difficulty breathing. EXAM: PORTABLE CHEST 1 VIEW COMPARISON:  None. FINDINGS: The lungs are well-aerated. Patchy bilateral airspace opacification is concerning for multifocal pneumonia. Mild vascular congestion is suggested. There is no evidence of pleural effusion or pneumothorax. The cardiomediastinal silhouette is borderline normal in size. No acute osseous abnormalities are seen. IMPRESSION: Patchy bilateral airspace opacification is concerning for multifocal pneumonia. Mild vascular congestion suggested. Electronically Signed   By: Roanna RaiderJeffery  Chang M.D.   On: 02/27/2018 01:41   Dg Shoulder Right Portable  Result Date: 02/27/2018 CLINICAL DATA:  57 y/o  F; post shoulder reduction. EXAM: PORTABLE RIGHT SHOULDER COMPARISON:  02/26/2018 right shoulder radiograph FINDINGS: Interval reduction of the right shoulder dislocation. The humeral head is well seated on the glenoid. No new dislocation or fracture identified. IMPRESSION: Interval reduction of right shoulder dislocation. Humeral head is well seated on the glenoid. Electronically Signed   By: Mitzi Hansen M.D.   On: 02/27/2018 01:11    EKG:   Orders placed or performed during the hospital encounter of 02/26/18  . ED EKG  . ED EKG    IMPRESSION AND PLAN:  Principal Problem:   Acute respiratory failure with hypoxia (HCC) -unclear etiology, though sedating medications almost certainly played a role.  Patient did not receive larger than normal doses, though she seemed to have unforeseeable he reacted very strongly to these medicines.  We will continue BiPAP for now until the patient is able to wean, see below for treatment of other conditions Active Problems:   Anterior dislocation of right shoulder -successfully reduced, continue immobilizer   Pneumonia -unclear if her x-ray imaging represents pneumonia, possibly from aspiration during her hypoxia event.  Covered her with antibiotics upfront.  Would repeat chest x-ray in the morning to see if it is any different, just to rule out any possibility of edema as a causative agent of her x-ray opacities   HTN (hypertension) -continue home meds   Diabetes (HCC) -sliding scale insulin with corresponding glucose checks  Chart review performed and case discussed with ED provider. Labs, imaging and/or ECG reviewed by provider and discussed with patient/family. Management plans discussed with the patient and/or family.  DVT PROPHYLAXIS: SubQ lovenox  GI PROPHYLAXIS: None  ADMISSION STATUS: Observation  CODE STATUS: Full  TOTAL TIME TAKING CARE OF THIS PATIENT: 40 minutes.   Randell Teare FIELDING 02/27/2018, 2:42 AM  [employer]  [phone]  CC: Primary care physician; [facility]  Note:  This document was prepared using Conservation officer, historic buildings and may include unintentional dictation errors.

## 2018-02-27 NOTE — Progress Notes (Signed)
Pharmacy Antibiotic Note  Valerie Chang is a 57 y.o. female admitted on 02/26/2018 with possible aspiration pneumonia.  Pharmacy has been consulted for Unasyn dosing.  Plan: Unasyn 3 g IV q6h  Height:  (170.2 cm) Weight: 168 lb 10.4 oz (76.5 kg) IBW/kg (Calculated) : 61.6  Temp (24hrs), Avg:98.2 F (36.8 C), Min:98 F (36.7 C), Max:98.6 F (37 C)  Recent Labs  Lab 02/27/18 0119 02/27/18 0642  WBC 12.7* 12.5*  CREATININE 0.52 0.46    Estimated Creatinine Clearance: 85.8 mL/min (by C-G formula based on SCr of 0.46 mg/dL).    Allergies  Allergen Reactions  . Ketamine Other (See Comments)    Developed acute pulmonary edema and hypoxemia after procedural sedation with ketamine, but also had morphine prior to the ketamine. Uncertain which agent was causative.  . Morphine And Related     Developed acute pulmonary edema and hypoxemia after procedural sedation with ketamine, but also had morphine prior to the ketamine. Uncertain which agent was causative.    Antimicrobials this admission: Meropenem 5/31 >>5/31 Unasyn 5/31 >>  Dose adjustments this admission:   Microbiology results: 5/31 MRSA PCR: neg  Thank you for allowing pharmacy to be a part of this patient's care.  Marty Heck 02/27/2018 3:16 PM

## 2018-02-27 NOTE — Progress Notes (Signed)
Pt received from ICU to room 127. Interpretor was used during transfer process to explain the plan of care and orient the patient to the new room. Pt denies any pain at this time. Verbalized understanding of how to use call system and to call before getting up. Questions answered for patient and husband who is at the bedside. Denies any needs at this time. Will continue to monitor.

## 2018-02-27 NOTE — Sedation Documentation (Signed)
Vital signs stable. 2nd attemopt to trial pt off NRB. Pt coughing and respondiing to verbal stimuli, able to follow commands.

## 2018-02-28 ENCOUNTER — Observation Stay: Payer: Self-pay

## 2018-02-28 LAB — BASIC METABOLIC PANEL
Anion gap: 8 (ref 5–15)
BUN: 12 mg/dL (ref 6–20)
CALCIUM: 8.4 mg/dL — AB (ref 8.9–10.3)
CO2: 27 mmol/L (ref 22–32)
CREATININE: 0.38 mg/dL — AB (ref 0.44–1.00)
Chloride: 104 mmol/L (ref 101–111)
GFR calc non Af Amer: >60 mL/min (ref >60)
Glucose, Bld: 165 mg/dL — ABNORMAL HIGH (ref 65–99)
Potassium: 3.4 mmol/L — ABNORMAL LOW (ref 3.5–5.1)
Sodium: 139 mmol/L (ref 135–145)

## 2018-02-28 LAB — GLUCOSE, CAPILLARY
GLUCOSE-CAPILLARY: 165 mg/dL — AB (ref 65–99)
GLUCOSE-CAPILLARY: 154 mg/dL — AB (ref 65–99)

## 2018-02-28 LAB — CBC WITH DIFFERENTIAL/PLATELET
BASOS PCT: 0 %
Basophils Absolute: 0 10*3/uL (ref 0–0.1)
EOS ABS: 0 10*3/uL (ref 0–0.7)
EOS PCT: 1 %
HCT: 33.8 % — ABNORMAL LOW (ref 35.0–47.0)
HEMOGLOBIN: 11.1 g/dL — AB (ref 12.0–16.0)
LYMPHS ABS: 2.8 10*3/uL (ref 1.0–3.6)
Lymphocytes Relative: 38 %
MCH: 26 pg (ref 26.0–34.0)
MCHC: 32.8 g/dL (ref 32.0–36.0)
MCV: 79.5 fL — ABNORMAL LOW (ref 80.0–100.0)
MONO ABS: 0.5 10*3/uL (ref 0.2–0.9)
MONOS PCT: 7 %
NEUTROS PCT: 54 %
Neutro Abs: 4.1 10*3/uL (ref 1.4–6.5)
PLATELETS: 222 10*3/uL (ref 150–440)
RBC: 4.25 MIL/uL (ref 3.80–5.20)
RDW: 16.1 % — AB (ref 11.5–14.5)
WBC: 7.5 10*3/uL (ref 3.6–11.0)

## 2018-02-28 LAB — HIV ANTIBODY (ROUTINE TESTING W REFLEX): HIV SCREEN 4TH GENERATION: NONREACTIVE

## 2018-02-28 MED ORDER — ACETAMINOPHEN 325 MG PO TABS: 650.0000 mg | ORAL_TABLET | Freq: Four times a day (QID) | ORAL | Status: AC | PRN

## 2018-02-28 MED ORDER — HYDROCODONE-ACETAMINOPHEN 5-325 MG PO TABS: 1.0000 | ORAL_TABLET | Freq: Four times a day (QID) | ORAL | 0 refills | Status: DC | PRN

## 2018-02-28 NOTE — Progress Notes (Signed)
Pt D/C to home with husband. Pt education given with interpreter at bedside. All questions answered. IVs removed intact. VSS. Tele monitor removed. Taken out to visitors entrance by [employer], Charity fundraiserN.

## 2018-02-28 NOTE — Discharge Summary (Signed)
[employer] - [place] at [facility]  Valerie Chang, 57 y.o., DOB 06/03/1961, MRN [id number]. Admission date: 02/26/2018 Discharge Date 02/28/2018 Primary MD [facility] Admitting Physician Oralia Manisavid Willis, MD  Admission Diagnosis  Acute pulmonary edema [facility](HCC) [J81.0] Fall [W19.XXXA] Multiple abrasions [T07.XXXA] Fall, initial encounter [id number][W19.XXXA] Anterior shoulder dislocation, right, initial encounter [S43.014A] Acute respiratory failure with hypoxemia (HCC) [J96.01]  Discharge Diagnosis   Principal Problem:   Acute respiratory failure with hypoxia (HCC)   Anterior dislocation of right shoulder   HTN (hypertension)   Diabetes [facility](HCC)         Hospital Course  Valerie PaisMaria Carbajal Chang  is a 57 y.o. female who presents with shoulder pain after mechanical fall.  Patient was found here to have dislocation of her shoulder.  She received some sedating medications in preparation for reduction.  Reduction of the shoulder by ED physician was successful.  Patient subsequently became very lethargic and somnolent.  She became hypoxic, requiring BiPAP.  Blood pressure also dropped significantly.  Patient began to wake up, but continued to require BiPAP, and hospitalist were called for admission and further evaluation.  Patient was admitted for further evaluation chest x-ray showed possible pneumonia.  Patient was continued on oxygen therapy.  Which was subsequently weaned off.  Repeat chest x-ray shows normalization of the x-ray.  Patient will need to keep the sling in place in her arm for 3 weeks.  She will also need to follow-up with orthopedic surgery within a week.               Consults  None  Significant Tests:  See full reports for all details    Dg Shoulder Right  Result Date: 02/26/2018 CLINICAL DATA:  57 y/o F; mechanical fall with right upper arm, elbow, and shoulder pain. EXAM: RIGHT SHOULDER - 2+ VIEW COMPARISON:  None.  FINDINGS: Right anterior shoulder dislocation. No acute fracture identified. Normal acromioclavicular and coracoclavicular intervals. IMPRESSION: Right anterior shoulder dislocation. No acute fracture identified. Electronically Signed   By: Mitzi HansenLance  Furusawa-Stratton M.D.   On: 02/26/2018 22:20   Dg Elbow 2 Views Right  Result Date: 02/26/2018 CLINICAL DATA:  57 y/o F; mechanical fall with right upper arm, elbow, and shoulder pain. EXAM: RIGHT ELBOW - 2 VIEW COMPARISON:  None. FINDINGS: There is no evidence of fracture, dislocation, or joint effusion. There is no evidence of arthropathy or other focal bone abnormality. Soft tissues are unremarkable. IMPRESSION: Negative. Electronically Signed   By: Mitzi HansenLance  Furusawa-Stratton M.D.   On: 02/26/2018 22:21   Dg Chest Port 1 View  Result Date: 02/28/2018 CLINICAL DATA:  Followup for pulmonary edema. EXAM: PORTABLE CHEST 1 VIEW COMPARISON:  02/27/2018. FINDINGS: Cardiac silhouette is normal in size. No mediastinal or hilar masses. Patchy areas of opacity noted in the lungs on the prior study have resolved. Lungs are now clear. No convincing pleural effusion.  No pneumothorax. IMPRESSION: 1. Resolved pulmonary edema. 2. No acute cardiopulmonary disease. Electronically Signed   By: Amie Portlandavid  Ormond M.D.   On: 02/28/2018 07:16   Dg Chest Portable 1 View  Result Date: 02/27/2018 CLINICAL DATA:  Acute onset of difficulty breathing. EXAM: PORTABLE CHEST 1 VIEW COMPARISON:  None. FINDINGS: The lungs are well-aerated. Patchy bilateral airspace opacification is concerning for multifocal pneumonia. Mild vascular congestion is suggested. There is no evidence of pleural effusion or pneumothorax. The cardiomediastinal silhouette is borderline normal in size. No acute osseous abnormalities are seen. IMPRESSION: Patchy bilateral airspace opacification is  concerning for multifocal pneumonia. Mild vascular congestion suggested. Electronically Signed   By: Roanna Raider M.D.   On:  02/27/2018 01:41   Dg Shoulder Right Portable  Result Date: 02/27/2018 CLINICAL DATA:  57 y/o  F; post shoulder reduction. EXAM: PORTABLE RIGHT SHOULDER COMPARISON:  02/26/2018 right shoulder radiograph FINDINGS: Interval reduction of the right shoulder dislocation. The humeral head is well seated on the glenoid. No new dislocation or fracture identified. IMPRESSION: Interval reduction of right shoulder dislocation. Humeral head is well seated on the glenoid. Electronically Signed   By: Mitzi Hansen M.D.   On: 02/27/2018 01:11       Today   Subjective:   Valerie Chang doing well denies any complaints Objective:   Blood pressure (!) 150/73, pulse 81, temperature 98.3 F (36.8 C), temperature source Oral, resp. rate 18, height 5\' 7"  (1.702 m), weight 76.5 kg (168 lb 10.4 oz), SpO2 94 %.  .  Intake/Output Summary (Last 24 hours) at 02/28/2018 1412 Last data filed at 02/28/2018 1024 Gross per 24 hour  Intake 340 ml  Output -  Net 340 ml    Exam VITAL SIGNS: Blood pressure (!) 150/73, pulse 81, temperature 98.3 F (36.8 C), temperature source Oral, resp. rate 18, height 5\' 7"  (1.702 m), weight 76.5 kg (168 lb 10.4 oz), SpO2 94 %.  GENERAL:  57 y.o.-year-old patient lying in the bed with no acute distress.  EYES: Pupils equal, round, reactive to light and accommodation. No scleral icterus. Extraocular muscles intact.  HEENT: Head atraumatic, normocephalic. Oropharynx and nasopharynx clear.  NECK:  Supple, no jugular venous distention. No thyroid enlargement, no tenderness.  LUNGS: Normal breath sounds bilaterally, no wheezing, rales,rhonchi or crepitation. No use of accessory muscles of respiration.  CARDIOVASCULAR: S1, S2 normal. No murmurs, rubs, or gallops.  ABDOMEN: Soft, nontender, nondistended. Bowel sounds present. No organomegaly or mass.  EXTREMITIES: No pedal edema, cyanosis, or clubbing.  NEUROLOGIC: Cranial nerves II through XII are intact. Muscle  strength 5/5 in all extremities. Sensation intact. Gait not checked.  PSYCHIATRIC: The patient is alert and oriented x 3.  SKIN: No obvious rash, lesion, or ulcer.   Data Review     CBC w Diff:  Lab Results  Component Value Date   WBC 7.5 02/28/2018   HGB 11.1 (L) 02/28/2018   HCT 33.8 (L) 02/28/2018   PLT 222 02/28/2018   LYMPHOPCT 38 02/28/2018   MONOPCT 7 02/28/2018   EOSPCT 1 02/28/2018   BASOPCT 0 02/28/2018   CMP:  Lab Results  Component Value Date   NA 139 02/28/2018   K 3.4 (L) 02/28/2018   CL 104 02/28/2018   CO2 27 02/28/2018   BUN 12 02/28/2018   CREATININE 0.38 (L) 02/28/2018   PROT 7.3 02/27/2018   ALBUMIN 3.8 02/27/2018   BILITOT 0.5 02/27/2018   ALKPHOS 130 (H) 02/27/2018   AST 44 (H) 02/27/2018   ALT 52 02/27/2018  .  Micro Results Recent Results (from the past 240 hour(s))  MRSA PCR Screening     Status: None   Collection Time: 02/27/18  4:12 AM  Result Value Ref Range Status   MRSA by PCR NEGATIVE NEGATIVE Final    Comment:        The GeneXpert MRSA Assay (FDA approved for NASAL specimens only), is one component of a comprehensive MRSA colonization surveillance program. It is not intended to diagnose MRSA infection nor to guide or monitor treatment for MRSA infections. Performed at [facility],  [address] [place], [place] [address]         Code Status Orders  (From admission, onward)        Start     Ordered   02/27/18 0422  Full code  Continuous     02/27/18 0421    Code Status History    This patient has a current code status but no historical code status.          Follow-up Information    Hooten, Illene Labrador, MD. Schedule an appointment as soon as possible for a visit in 5 days.   Specialty:  Orthopedic Surgery Why:  for a follow up appointment, displaced shoulder // Office Closed  Contact information: [address] [facility] [place] [address] [phone]        Center,  [facility] In 6 days.   Specialty:  General Practice Why:  hosp f/u // Office Closed  Contact information: [address] [place] [address] [phone]           Discharge Medications   Allergies as of 02/28/2018      Reactions   Ketamine Other (See Comments)   Developed acute pulmonary edema and hypoxemia after procedural sedation with ketamine, but also had morphine prior to the ketamine. Uncertain which agent was causative.   Morphine And Related    Developed acute pulmonary edema and hypoxemia after procedural sedation with ketamine, but also had morphine prior to the ketamine. Uncertain which agent was causative.      Medication List    TAKE these medications   acetaminophen 325 MG tablet Commonly known as:  TYLENOL Take 2 tablets (650 mg total) by mouth every 6 (six) hours as needed for mild pain (or Fever >/= 101).   HYDROcodone-acetaminophen 5-325 MG tablet Commonly known as:  NORCO/VICODIN Take 1 tablet by mouth every 6 (six) hours as needed for moderate pain or severe pain.          Total Time in preparing paper work, data evaluation and todays exam - 35 minutes  Auburn Bilberry M.D on 02/28/2018 at 2:12 PM [employer]   Office  [phone]

## 2020-12-26 ENCOUNTER — Other Ambulatory Visit: Payer: Self-pay

## 2020-12-26 ENCOUNTER — Ambulatory Visit: Payer: Self-pay | Attending: Oncology | Admitting: *Deleted

## 2020-12-26 ENCOUNTER — Ambulatory Visit
Admission: RE | Admit: 2020-12-26 | Discharge: 2020-12-26 | Disposition: A | Discharge status: Home / Self Care | Payer: Self-pay | Source: Ambulatory Visit | Attending: Oncology | Admitting: Oncology

## 2020-12-26 ENCOUNTER — Encounter: Payer: Self-pay | Admitting: *Deleted

## 2020-12-26 VITALS — BP 174/72 | HR 70 | Temp 97.8°F | Ht 67.0 in | Wt 155.3 lb

## 2020-12-26 DIAGNOSIS — Z0000 Encounter for general adult medical examination without abnormal findings: Secondary | ICD-10-CM | POA: Insufficient documentation

## 2020-12-26 NOTE — Progress Notes (Signed)
Subjective:     Patient ID: Valerie Chang, female   DOB: [birth year], 60 y.o.   MRN: [id number]  HPI   [facility] Medical History Record - 12/26/20 0825      Breast History   Screening cycle New    Provider (CBE) [facility]    Initial Mammogram 12/26/20    Last Mammogram Annual    Last Mammogram Date 05/20/17    Provider (Mammogram)  [facility]; report in EPIC    Recent Breast Symptoms None   lump last week ; put cream on it and it went away     Breast Cancer History   Breast Cancer History No personal or family history      Previous History of Breast Problems   Breast Surgery or Biopsy None    Breast Implants N/A    BSE Done Monthly      Gynecological/Obstetrical History   LMP --   7 years ago   Is there any chance that the client could be pregnant?  No    Age at menarche 52    Age at menopause 60    PAP smear history Annually    Date of last PAP  07/17/19    Provider (PAP) [facility]    Age at first live birth 60    Breast fed children Yes (type length in comments)   2 years   DES Exposure No    Cervical, Uterine or Ovarian cancer No    Family history of Cervial, Uterine or Ovarian cancer No    Hysterectomy No    Cervix removed No    Ovaries removed No    Laser/Cryosurgery No    Current method of birth control None    Current method of Estrogen/Hormone replacement None    Smoking history None    Comments No insuranc, medicare or medicaid             Review of Systems     Objective:   Physical Exam Chest:  Breasts:     Right: No swelling, bleeding, inverted nipple, mass, nipple discharge, skin change, tenderness, axillary adenopathy or supraclavicular adenopathy.     Left: No swelling, bleeding, inverted nipple, mass, nipple discharge, skin change, tenderness, axillary adenopathy or supraclavicular adenopathy.     Lymphadenopathy:     Upper Body:     Right upper body: No supraclavicular or axillary adenopathy.     Left  upper body: No supraclavicular or axillary adenopathy.        Assessment:     60 year old Hispanic female referred to [facility] by the Jeralyn Ruths for further evaluation of a left breast mass.  Valerie Chang, the interpreter present during the interview and exam.  Patient states she self palpated a painful lump in the left breast near the nipple.  States she used a "cream from the [place] market" and it went away within 3 day.  On clinical breast exam there is no dominant mass, nipple discharge, skin changes or lymphadenopathy.  Last pap on 07/17/19 was negative without HPV co-testing.  Next pap due in 2023.  Patient has been screened for eligibility.  She does not have any insurance, Medicare or Medicaid.  She also meets financial eligibility.   Risk Assessment    Risk Scores      12/26/2020   Last edited by: Scarlett Presto, RN   5-year risk:    Lifetime risk:  Plan:     Screening mammogram ordered.  Will follow up per B[facility]CP protocol.

## 2020-12-26 NOTE — Patient Instructions (Signed)
Gave patient hand-out, Women Staying Healthy, Active and Well from B[facility]CP, with education on breast health, pap smears, heart and colon health. 

## 2020-12-27 ENCOUNTER — Other Ambulatory Visit: Payer: Self-pay | Admitting: *Deleted

## 2020-12-27 ENCOUNTER — Inpatient Hospital Stay
Admission: RE | Admit: 2020-12-27 | Discharge: 2020-12-27 | Disposition: A | Discharge status: Home / Self Care | Payer: Self-pay | Source: Ambulatory Visit | Attending: *Deleted | Admitting: *Deleted

## 2020-12-27 DIAGNOSIS — Z1231 Encounter for screening mammogram for malignant neoplasm of breast: Secondary | ICD-10-CM

## 2021-01-03 ENCOUNTER — Encounter: Payer: Self-pay | Admitting: *Deleted

## 2021-01-03 NOTE — Progress Notes (Signed)
Letter mailed from the [facility] to inform patient of her normal mammogram results.  Patient is to follow-up with annual screening in one year. 

## 2021-10-28 ENCOUNTER — Emergency Department
Admission: EM | Admit: 2021-10-28 | Discharge: 2021-10-28 | Disposition: A | Discharge status: Home / Self Care | Payer: Self-pay | Attending: Emergency Medicine | Admitting: Emergency Medicine

## 2021-10-28 ENCOUNTER — Other Ambulatory Visit: Payer: Self-pay

## 2021-10-28 ENCOUNTER — Emergency Department: Payer: Self-pay

## 2021-10-28 ENCOUNTER — Encounter: Payer: Self-pay | Admitting: Emergency Medicine

## 2021-10-28 DIAGNOSIS — J029 Acute pharyngitis, unspecified: Secondary | ICD-10-CM | POA: Insufficient documentation

## 2021-10-28 DIAGNOSIS — R1310 Dysphagia, unspecified: Secondary | ICD-10-CM

## 2021-10-28 DIAGNOSIS — R591 Generalized enlarged lymph nodes: Secondary | ICD-10-CM | POA: Insufficient documentation

## 2021-10-28 DIAGNOSIS — I10 Essential (primary) hypertension: Secondary | ICD-10-CM | POA: Insufficient documentation

## 2021-10-28 DIAGNOSIS — R1312 Dysphagia, oropharyngeal phase: Secondary | ICD-10-CM | POA: Insufficient documentation

## 2021-10-28 DIAGNOSIS — K0401 Reversible pulpitis: Secondary | ICD-10-CM | POA: Insufficient documentation

## 2021-10-28 DIAGNOSIS — E119 Type 2 diabetes mellitus without complications: Secondary | ICD-10-CM | POA: Insufficient documentation

## 2021-10-28 DIAGNOSIS — M542 Cervicalgia: Secondary | ICD-10-CM | POA: Insufficient documentation

## 2021-10-28 LAB — CBC WITH DIFFERENTIAL/PLATELET
Abs Immature Granulocytes: 0.01 K/uL (ref 0.00–0.07)
Basophils Absolute: 0 K/uL (ref 0.0–0.1)
Basophils Relative: 0 %
Eosinophils Absolute: 0.1 K/uL (ref 0.0–0.5)
Eosinophils Relative: 1 %
HCT: 33.8 % — ABNORMAL LOW (ref 36.0–46.0)
Hemoglobin: 9.8 g/dL — ABNORMAL LOW (ref 12.0–15.0)
Immature Granulocytes: 0 %
Lymphocytes Relative: 36 %
Lymphs Abs: 2.9 K/uL (ref 0.7–4.0)
MCH: 20.8 pg — ABNORMAL LOW (ref 26.0–34.0)
MCHC: 29 g/dL — ABNORMAL LOW (ref 30.0–36.0)
MCV: 71.8 fL — ABNORMAL LOW (ref 80.0–100.0)
Monocytes Absolute: 0.6 K/uL (ref 0.1–1.0)
Monocytes Relative: 7 %
Neutro Abs: 4.4 K/uL (ref 1.7–7.7)
Neutrophils Relative %: 56 %
Platelets: 341 K/uL (ref 150–400)
RBC: 4.71 MIL/uL (ref 3.87–5.11)
RDW: 18.7 % — ABNORMAL HIGH (ref 11.5–15.5)
WBC: 8 K/uL (ref 4.0–10.5)
nRBC: 0 % (ref 0.0–0.2)

## 2021-10-28 LAB — BASIC METABOLIC PANEL WITH GFR
Anion gap: 10 (ref 5–15)
BUN: 12 mg/dL (ref 6–20)
CO2: 28 mmol/L (ref 22–32)
Calcium: 8.9 mg/dL (ref 8.9–10.3)
Chloride: 100 mmol/L (ref 98–111)
Creatinine, Ser: 0.57 mg/dL (ref 0.44–1.00)
GFR, Estimated: >60 mL/min (ref >60)
Glucose, Bld: 122 mg/dL — ABNORMAL HIGH (ref 70–99)
Potassium: 3.1 mmol/L — ABNORMAL LOW (ref 3.5–5.1)
Sodium: 138 mmol/L (ref 135–145)

## 2021-10-28 LAB — GROUP A STREP BY PCR: Group A Strep by PCR: NOT DETECTED

## 2021-10-28 MED ORDER — PENICILLIN V POTASSIUM 500 MG PO TABS: 500.0000 mg | ORAL_TABLET | Freq: Four times a day (QID) | ORAL | 0 refills | Status: AC

## 2021-10-28 MED ORDER — IOHEXOL 300 MG/ML  SOLN
75.0000 mL | Freq: Once | INTRAMUSCULAR | Status: AC | PRN
  Administered 2021-10-28: 75 mL via INTRAVENOUS

## 2021-10-28 MED ORDER — OMEPRAZOLE MAGNESIUM 20 MG PO TBEC: 20.0000 mg | DELAYED_RELEASE_TABLET | Freq: Every day | ORAL | 1 refills | Status: AC

## 2021-10-28 MED ORDER — LIDOCAINE VISCOUS HCL 2 % MT SOLN: 15.0000 mL | OROMUCOSAL | 0 refills | Status: AC | PRN

## 2021-10-28 MED ORDER — POTASSIUM CHLORIDE CRYS ER 20 MEQ PO TBCR
40.0000 meq | EXTENDED_RELEASE_TABLET | Freq: Once | ORAL | Status: AC
  Administered 2021-10-28: 40 meq via ORAL
  Filled 2021-10-28: qty 2

## 2021-10-28 MED ORDER — KETOROLAC TROMETHAMINE 30 MG/ML IJ SOLN
15.0000 mg | Freq: Once | INTRAMUSCULAR | Status: AC
  Administered 2021-10-28: 15 mg via INTRAVENOUS
  Filled 2021-10-28: qty 1

## 2021-10-28 NOTE — ED Provider Notes (Signed)
[facility] Provider Note    Event Date/Time   First MD Initiated Contact with Patient 10/28/21 1936     (approximate)   History   Chief Complaint Lymphadenopathy   HPI  Valerie Chang is a 61 y.o. female with past medical history of hypertension and diabetes who presents to the ED complaining of sore throat and neck pain.  Patient reports that for the past 24 to 48 hours she has been dealing with increasing pain in her throat and the right side of her neck.  She states it is particularly painful when she goes to swallow as well as along her teeth in the bottom of her mouth.  She has noticed some painful swollen areas on the right side of her neck but has not noticed any swelling beneath her chin.  She denies any fevers, cough, chest pain, or shortness of breath.  She reports recent issues with her teeth, but has not been able to afford seeing a dentist.     Physical Exam   Triage Vital Signs: ED Triage Vitals  Enc Vitals Group     BP 10/28/21 1835 (!) 181/79     Pulse Rate 10/28/21 1835 92     Resp 10/28/21 1835 16     Temp 10/28/21 1835 98.4 F (36.9 C)     Temp Source 10/28/21 1835 Oral     SpO2 10/28/21 1835 100 %     Weight 10/28/21 1836 150 lb (68 kg)     Height 10/28/21 1836 5\' 7"  (1.702 m)     Head Circumference --      Peak Flow --      Pain Score 10/28/21 1835 10     Pain Loc --      Pain Edu? --      Excl. in GC? --     Most recent vital signs: Vitals:   10/28/21 2144 10/28/21 2300  BP: (!) 158/73 (!) 155/65  Pulse: 78 72  Resp: 16 16  Temp:    SpO2: 100% 100%    Constitutional: Alert and oriented. Eyes: Conjunctivae are normal. Head: Atraumatic. Nose: No congestion/rhinnorhea. Mouth/Throat: Mucous membranes are moist.  Posterior oropharynx erythematous with no edema or exudate noted.  Extremely poor dentition with multiple broken teeth and erythema tracking along right gumline with no focal fluctuance.   Submandibular compartments are soft without tenderness. Neck: Right anterior and posterior cervical lymphadenopathy that is tender to palpation. Cardiovascular: Normal rate, regular rhythm. Grossly normal heart sounds.  2+ radial pulses bilaterally. Respiratory: Normal respiratory effort.  No retractions. Lungs CTAB. Gastrointestinal: Soft and nontender. No distention. Musculoskeletal: No lower extremity tenderness nor edema.  Neurologic:  Normal speech and language. No gross focal neurologic deficits are appreciated.    ED Results / Procedures / Treatments   Labs (all labs ordered are listed, but only abnormal results are displayed) Labs Reviewed  CBC WITH DIFFERENTIAL/PLATELET - Abnormal; Notable for the following components:      Result Value   Hemoglobin 9.8 (*)    HCT 33.8 (*)    MCV 71.8 (*)    MCH 20.8 (*)    MCHC 29.0 (*)    RDW 18.7 (*)    All other components within normal limits  BASIC METABOLIC PANEL - Abnormal; Notable for the following components:   Potassium 3.1 (*)    Glucose, Bld 122 (*)    All other components within normal limits  GROUP A STREP BY PCR  RADIOLOGY CT soft tissue neck reviewed by me with no obvious inflammatory changes or fluid collection.  PROCEDURES:  Critical Care performed: No  Procedures   MEDICATIONS ORDERED IN ED: Medications  ketorolac (TORADOL) 30 MG/ML injection 15 mg (15 mg Intravenous Given 10/28/21 2041)  iohexol (OMNIPAQUE) 300 MG/ML solution 75 mL (75 mLs Intravenous Contrast Given 10/28/21 2044)  potassium chloride SA (KLOR-CON M) CR tablet 40 mEq (40 mEq Oral Given 10/28/21 2153)     IMPRESSION / MDM / ASSESSMENT AND PLAN / ED COURSE  I reviewed the triage vital signs and the nursing notes.                              61 y.o. female with past medical history of hypertension and diabetes who presents to the ED complaining of increasing pain when swallowing along with pain along the right side of her mouth and into  her right neck for the past 24 to 48 hours.  Differential diagnosis includes, but is not limited to, strep pharyngitis, viral pharyngitis, dental abscess, pulpitis, Ludwick's angina, epiglottitis, peritonsillar abscess, malignancy.  Patient is nontoxic-appearing and in no acute distress, vital signs are reassuring.  She has significant tender lymphadenopathy on the right side of her neck, additionally has very poor dentition with signs of infection but no obvious focal abscess or signs of Ludwick's angina.  Examination of posterior oropharynx shows no obvious abscess, we will further assess with CT scan for focal infectious process, treat symptomatically with IV Toradol.  CBC shows chronic anemia and BMP shows no electrolyte abnormality or AKI.  Testing for strep pharyngitis is pending.  Testing for strep pharyngitis is negative, CT imaging of patient's neck is unremarkable with no apparent infection or lymphadenopathy.  On reassessment, patient reports pain is improved although she continues to report difficulty swallowing, now states this has been going on for greater than 1 year.  We will prescribe omeprazole for component of GERD along with penicillin for pulpitis and viscous lidocaine for her sore throat.  She was counseled to follow-up with her PCP and to return to the ED for new worsening symptoms, patient agrees with plan.       FINAL CLINICAL IMPRESSION(S) / ED DIAGNOSES   Final diagnoses:  Pulpitis  Sore throat  Odynophagia     Rx / DC Orders   ED Discharge Orders          Ordered    penicillin v potassium (VEETID) 500 MG tablet  4 times daily        10/28/21 2317    omeprazole (PRILOSEC OTC) 20 MG tablet  Daily        10/28/21 2317    lidocaine (XYLOCAINE) 2 % solution  As needed        10/28/21 2317             Note:  This document was prepared using Dragon voice recognition software and may include unintentional dictation errors.   Chesley Noon, MD 10/28/21  [phone]

## 2021-10-28 NOTE — ED Triage Notes (Signed)
Pt to ED via POV c/o swelling in the right jaw area and just at her hair line on the right posterior head. Pt states that the area is very tender as well. Pt denies fevers or chills. Pt is in NAD.

## 2023-09-22 ENCOUNTER — Encounter: Payer: Self-pay | Admitting: Physician Assistant

## 2023-09-22 ENCOUNTER — Telehealth: Payer: Self-pay | Admitting: Hematology and Oncology

## 2023-10-16 ENCOUNTER — Other Ambulatory Visit: Payer: Self-pay

## 2023-10-16 DIAGNOSIS — Z1231 Encounter for screening mammogram for malignant neoplasm of breast: Secondary | ICD-10-CM

## 2023-11-24 ENCOUNTER — Ambulatory Visit
Admission: RE | Admit: 2023-11-24 | Discharge: 2023-11-24 | Disposition: A | Discharge status: Home / Self Care | Payer: Self-pay | Source: Ambulatory Visit | Attending: Obstetrics and Gynecology | Admitting: Obstetrics and Gynecology

## 2023-11-24 ENCOUNTER — Ambulatory Visit: Payer: Self-pay | Attending: Hematology and Oncology | Admitting: *Deleted

## 2023-11-24 VITALS — BP 146/83 | Wt 147.8 lb

## 2023-11-24 DIAGNOSIS — Z1239 Encounter for other screening for malignant neoplasm of breast: Secondary | ICD-10-CM

## 2023-11-24 DIAGNOSIS — Z1231 Encounter for screening mammogram for malignant neoplasm of breast: Secondary | ICD-10-CM | POA: Insufficient documentation

## 2023-11-24 NOTE — Progress Notes (Signed)
 Valerie Chang Valerie Chang is a 63 y.o. female who presents to [facility] clinic today with no complaints.    Pap Smear: Pap smear not completed today. Last Pap smear was 11/01/2022 at [facility] and was normal with negative HPV. Per patient has no history of an abnormal Pap smear. Last Pap smear result is available in Epic.   Physical exam: Breasts Breasts symmetrical. No skin abnormalities bilateral breasts. No nipple retraction right breast. Left nipple slightly retracted that per patient is normal for her. No nipple discharge bilateral breasts. No lymphadenopathy. No lumps palpated bilateral breasts. Complaints of left outer breast tenderness on exam.  MS DIGITAL SCREENING TOMO BILATERAL Result Date: 12/27/2020 CLINICAL DATA:  Screening. EXAM: DIGITAL SCREENING BILATERAL MAMMOGRAM WITH TOMOSYNTHESIS AND CAD TECHNIQUE: Bilateral screening digital craniocaudal and mediolateral oblique mammograms were obtained. Bilateral screening digital breast tomosynthesis was performed. The images were evaluated with computer-aided detection. COMPARISON:  Previous exam(s). ACR Breast Density Category b: There are scattered areas of fibroglandular density. FINDINGS: There are no findings suspicious for malignancy. The images were evaluated with computer-aided detection. IMPRESSION: No mammographic evidence of malignancy. A result letter of this screening mammogram will be mailed directly to the patient. RECOMMENDATION: Screening mammogram in one year. (Code:SM-B-01Y) BI-RADS CATEGORY  1: Negative. Electronically Signed   By: Gerome Sam III M.D   On: 12/27/2020 13:04        Pelvic/Bimanual Pap is not indicated today per BCCCP guidelines.   Smoking History: Patient has never smoked.   Patient Navigation: Patient education provided. Access to services provided for patient through [employer] program. Spanish interpreter Delos Haring from [facility] provided.   Colorectal Cancer Screening: Per  patient has had colonoscopy completed on 12/12/2021.  No complaints today.    Breast and Cervical Cancer Risk Assessment: Patient does not have family history of breast cancer, known genetic mutations, or radiation treatment to the chest before age 15. Patient does not have history of cervical dysplasia, immunocompromised, or DES exposure in-utero.  Risk Scores as of Encounter on 11/24/2023     Valerie Chang           5-year 1.17%   Lifetime 6.02%   This patient is Hisp[place]/Latina but has no documented birth country, so the [place] model used data from [place] patients to calculate their risk score. Document a birth country in the Demographics activity for a more accurate score.         Last calculated by Narda Rutherford, LPN on 1/61/0960 at 11:13 AM        A: B[facility]CP exam without pap smear No complaints.  P: Referred patient to the [facility] for a screening mammogram. Appointment scheduled Monday, November 24, 2023 at 1120.  Priscille Heidelberg, RN 11/24/2023 11:19 AM

## 2023-11-24 NOTE — Patient Instructions (Signed)
 Explained breast self awareness with Valerie Chang. Patient did not need a Pap smear today due to last Pap smear and HPV typing was 11/01/2022. Let her know [facility] will cover Pap smears and HPV typing every 5 years unless has a history of abnormal Pap smears. Referred patient to the [facility] for a screening mammogram. Appointment scheduled Monday, November 24, 2023 at 1120. Patient aware of appointment and will be there. Let patient know [name] will follow up with her within the next couple weeks with results of mammogram by letter or phone. Valerie Chang verbalized understanding.  Tayvian Holycross, Kathaleen Maser, RN 11:19 AM

## 2024-01-06 ENCOUNTER — Encounter: Payer: Self-pay | Admitting: Family Medicine

## 2024-10-08 ENCOUNTER — Telehealth: Payer: Self-pay

## 2024-10-11 ENCOUNTER — Other Ambulatory Visit: Payer: Self-pay | Admitting: Family Medicine

## 2024-10-11 DIAGNOSIS — Z1231 Encounter for screening mammogram for malignant neoplasm of breast: Secondary | ICD-10-CM
